# Patient Record
Sex: Male | Born: 2000 | Race: Black or African American | Hispanic: No | Marital: Single | Smoking: Never smoker
Health system: Southern US, Community
[De-identification: ages and names within clinical notes are randomized; demographics above are authoritative.]

---
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
## 2019-04-14 ENCOUNTER — Other Ambulatory Visit: Payer: Self-pay

## 2019-04-14 DIAGNOSIS — Z20822 Contact with and (suspected) exposure to covid-19: Secondary | ICD-10-CM

## 2019-04-18 LAB — NOVEL CORONAVIRUS, NAA: SARS-CoV-2, NAA: DETECTED — AB

## 2019-04-21 ENCOUNTER — Telehealth: Payer: Self-pay | Admitting: *Deleted

## 2019-04-21 NOTE — Telephone Encounter (Signed)
Tare from [facility] called for pt contact number.

## 2019-05-11 ENCOUNTER — Ambulatory Visit: Payer: Self-pay | Attending: Internal Medicine

## 2019-05-12 ENCOUNTER — Ambulatory Visit: Payer: Self-pay | Attending: Internal Medicine

## 2019-05-12 ENCOUNTER — Other Ambulatory Visit: Payer: Self-pay

## 2019-05-12 DIAGNOSIS — Z20822 Contact with and (suspected) exposure to covid-19: Secondary | ICD-10-CM

## 2019-05-14 LAB — NOVEL CORONAVIRUS, NAA: SARS-CoV-2, NAA: NOT DETECTED

## 2021-10-25 ENCOUNTER — Ambulatory Visit
Admission: EM | Admit: 2021-10-25 | Discharge: 2021-10-25 | Disposition: A | Discharge status: Home / Self Care | Payer: BLUE CROSS/BLUE SHIELD | Attending: Nurse Practitioner | Admitting: Nurse Practitioner

## 2021-10-25 DIAGNOSIS — L03116 Cellulitis of left lower limb: Secondary | ICD-10-CM | POA: Diagnosis not present

## 2021-10-25 MED ORDER — SULFAMETHOXAZOLE-TRIMETHOPRIM 800-160 MG PO TABS: 1.0000 | ORAL_TABLET | Freq: Two times a day (BID) | ORAL | 0 refills | Status: AC

## 2021-10-25 NOTE — Discharge Instructions (Addendum)
-   Please take the entire prescription of Bactrim which will treat the infection -You can take Tylenol and/or ibuprofen as needed for pain -Please seek care if symptoms persist or worsen despite treatment

## 2021-10-25 NOTE — ED Provider Notes (Signed)
[facility]    CSN: [id number] Arrival date & time: 10/25/21  1507      History   Chief Complaint Chief Complaint  Patient presents with   Insect Bite    HPI Isaiah Porter is a 21 y.o. male.   Patient presents today for insect bite to left thigh and right inner thigh.  He noticed the bite on the left leg 2 days ago and the area on the inner thigh earlier today.  Reports both of the areas are red and the leg thigh bite is becoming larger and more red/swollen.  He denies seeing an insect bite him.  Also denies fevers, nausea/vomiting.  He has not taken anything for the bites or tried anything.    History reviewed. No pertinent past medical history.  Patient Active Problem List   Diagnosis Date Noted   Poor school compliance 03/27/2012   INFLUENZA DUE TO ID NOVEL H1N1 INFLUENZA VIRUS 03/01/2008    History reviewed. No pertinent surgical history.     Home Medications    Prior to Admission medications   Medication Sig Start Date End Date Taking? Authorizing Provider  sulfamethoxazole-trimethoprim (BACTRIM DS) 800-160 MG tablet Take 1 tablet by mouth 2 (two) times daily for 5 days. 10/25/21 10/30/21 Yes Cathlean Marseilles A, NP  adapalene (DIFFERIN) 0.1 % cream Apply topically at bedtime. 02/18/14   Tommie Sams, DO  mupirocin ointment (BACTROBAN) 2 % Place 1 application into the nose 2 (two) times daily. To rash for one week 05/12/13   Truddie Coco, DO    Family History History reviewed. No pertinent family history.  Social History Social History   Tobacco Use   Smoking status: Never   Smokeless tobacco: Never     Allergies   Patient has no known allergies.   Review of Systems Review of Systems Per HPI  Physical Exam Triage Vital Signs ED Triage Vitals  Enc Vitals Group     BP 10/25/21 1521 112/69     Pulse Rate 10/25/21 1521 69     Resp 10/25/21 1521 18     Temp 10/25/21 1521 99.1 F (37.3 C)     Temp src --      SpO2 10/25/21 1521 96 %      Weight --      Height --      Head Circumference --      Peak Flow --      Pain Score 10/25/21 1519 2     Pain Loc --      Pain Edu? --      Excl. in GC? --    No data found.  Updated Vital Signs BP 112/69   Pulse 69   Temp 99.1 F (37.3 C)   Resp 18   SpO2 96%   Visual Acuity Right Eye Distance:   Left Eye Distance:   Bilateral Distance:    Right Eye Near:   Left Eye Near:    Bilateral Near:     Physical Exam Vitals and nursing note reviewed.  Constitutional:      General: He is not in acute distress.    Appearance: Normal appearance. He is not toxic-appearing.  Pulmonary:     Effort: Pulmonary effort is normal. No respiratory distress.  Skin:    General: Skin is warm and dry.     Findings: Erythema present.       Neurological:     Mental Status: He is alert and oriented to person, place,  and time.  Psychiatric:        Behavior: Behavior is cooperative.      UC Treatments / Results  Labs (all labs ordered are listed, but only abnormal results are displayed) Labs Reviewed - No data to display  EKG   Radiology No results found.  Procedures Procedures (including critical care time)  Medications Ordered in UC Medications - No data to display  Initial Impression / Assessment and Plan / UC Course  I have reviewed the triage vital signs and the nursing notes.  Pertinent labs & imaging results that were available during my care of the patient were reviewed by me and considered in my medical decision making (see chart for details).    Patient is a very pleasant, 21 year old male presenting for an insect bite.  I am concerned about cellulitis of the left lower extremity.  Treat with Bactrim DS twice daily for 5 days.  Seek care if symptoms persist or worsen despite treatment. Final Clinical Impressions(s) / UC Diagnoses   Final diagnoses:  Cellulitis of left lower extremity     Discharge Instructions      - Please take the entire  prescription of Bactrim which will treat the infection -You can take Tylenol and/or ibuprofen as needed for pain -Please seek care if symptoms persist or worsen despite treatment     ED Prescriptions     Medication Sig Dispense Auth. Provider   sulfamethoxazole-trimethoprim (BACTRIM DS) 800-160 MG tablet Take 1 tablet by mouth 2 (two) times daily for 5 days. 10 tablet Valentino Nose, NP      PDMP not reviewed this encounter.   Valentino Nose, NP 10/25/21 1537

## 2021-10-25 NOTE — ED Triage Notes (Signed)
Pt presents with c/o possible insect bite that has become swollen on upper left thigh

## 2021-10-31 ENCOUNTER — Ambulatory Visit
Admission: EM | Admit: 2021-10-31 | Discharge: 2021-10-31 | Disposition: A | Discharge status: Home / Self Care | Payer: BLUE CROSS/BLUE SHIELD | Attending: Nurse Practitioner | Admitting: Nurse Practitioner

## 2021-10-31 DIAGNOSIS — L0291 Cutaneous abscess, unspecified: Secondary | ICD-10-CM

## 2021-10-31 MED ORDER — DOXYCYCLINE HYCLATE 100 MG PO CAPS: 100.0000 mg | ORAL_CAPSULE | Freq: Two times a day (BID) | ORAL | 0 refills | Status: AC

## 2021-10-31 NOTE — ED Provider Notes (Signed)
[facility]    CSN: [id number] Arrival date & time: 10/31/21  1049      History   Chief Complaint Chief Complaint  Patient presents with   Insect Bite    HPI Isaiah Porter is a 21 y.o. male.   Patient presents with worsening skin irritation to left thigh.  Reports the area has become raised, is red, very tender to palpation, and starting to drain.  He denies fevers, nausea/vomiting.  He has taken full course of Bactrim without relief.   Patient presents with  History reviewed. No pertinent past medical history.  Patient Active Problem List   Diagnosis Date Noted   Poor school compliance 03/27/2012   INFLUENZA DUE TO ID NOVEL [id number] INFLUENZA VIRUS 03/01/2008    History reviewed. No pertinent surgical history.     Home Medications    Prior to Admission medications   Medication Sig Start Date End Date Taking? Authorizing Provider  doxycycline (VIBRAMYCIN) 100 MG capsule Take 1 capsule (100 mg total) by mouth 2 (two) times daily for 10 days. 10/31/21 11/10/21 Yes Cathlean Marseilles A, NP  adapalene (DIFFERIN) 0.1 % cream Apply topically at bedtime. 02/18/14   Tommie Sams, DO  mupirocin ointment (BACTROBAN) 2 % Place 1 application into the nose 2 (two) times daily. To rash for one week 05/12/13   Truddie Coco, DO    Family History Family History  Problem Relation Age of Onset   Healthy Mother     Social History Social History   Tobacco Use   Smoking status: Never   Smokeless tobacco: Never  Substance Use Topics   Alcohol use: Never   Drug use: Never     Allergies   Patient has no known allergies.   Review of Systems Review of Systems Per HPI  Physical Exam Triage Vital Signs ED Triage Vitals  Enc Vitals Group     BP 10/31/21 1219 125/68     Pulse Rate 10/31/21 1219 69     Resp 10/31/21 1219 16     Temp 10/31/21 1219 99.3 F (37.4 C)     Temp Source 10/31/21 1219 Oral     SpO2 10/31/21 1219 99 %     Weight --      Height --       Head Circumference --      Peak Flow --      Pain Score 10/31/21 1217 7     Pain Loc --      Pain Edu? --      Excl. in GC? --    No data found.  Updated Vital Signs BP 125/68 (BP Location: Right Arm)   Pulse 69   Temp 99.3 F (37.4 C) (Oral)   Resp 16   SpO2 99%   Visual Acuity Right Eye Distance:   Left Eye Distance:   Bilateral Distance:    Right Eye Near:   Left Eye Near:    Bilateral Near:     Physical Exam Vitals and nursing note reviewed.  Constitutional:      General: He is not in acute distress.    Appearance: Normal appearance. He is not toxic-appearing.  Pulmonary:     Effort: Pulmonary effort is normal. No respiratory distress.  Skin:    General: Skin is warm and dry.     Capillary Refill: Capillary refill takes less than 2 seconds.     Coloration: Skin is not jaundiced or pale.     Findings: Abscess and  erythema present. No rash.          Comments: Abscess to left thigh area marked; approximately 3 cm x 3 cm, fluctuant, raised, warm, tender to palpation  Neurological:     Mental Status: He is alert and oriented to person, place, and time.  Psychiatric:        Behavior: Behavior is cooperative.      UC Treatments / Results  Labs (all labs ordered are listed, but only abnormal results are displayed) Labs Reviewed - No data to display  EKG   Radiology No results found.  Procedures Incision and Drainage  Date/Time: 10/31/2021 2:18 PM  Performed by: Valentino Nose, NP Authorized by: Valentino Nose, NP   Consent:    Consent obtained:  Verbal   Consent given by:  Patient   Risks, benefits, and alternatives were discussed: yes     Risks discussed:  Bleeding, incomplete drainage and pain   Alternatives discussed:  No treatment Universal protocol:    Procedure explained and questions answered to patient or proxy's satisfaction: yes     Patient identity confirmed:  Verbally with patient Location:    Type:  Abscess   Size:  3 cm  x 3 cm   Location:  Lower extremity   Lower extremity location:  Leg   Leg location:  L upper leg Pre-procedure details:    Skin preparation:  Chlorhexidine and povidone-iodine Sedation:    Sedation type:  None Anesthesia:    Anesthesia method:  Local infiltration   Local anesthetic:  Lidocaine 2% WITH epi Procedure type:    Complexity:  Simple Procedure details:    Ultrasound guidance: no     Needle aspiration: no     Incision types:  Single straight   Incision depth:  Dermal   Drainage:  Purulent and bloody   Drainage amount:  Moderate   Wound treatment:  Wound left open   Packing materials:  None Post-procedure details:    Procedure completion:  Tolerated  (including critical care time)  Medications Ordered in UC Medications - No data to display  Initial Impression / Assessment and Plan / UC Course  I have reviewed the triage vital signs and the nursing notes.  Pertinent labs & imaging results that were available during my care of the patient were reviewed by me and considered in my medical decision making (see chart for details).    Patient is a very pleasant, well-appearing 21 year old male presenting for an abscess to his left thigh.  I&D as above.  Patient requested friend be in the room during procedure; the friend was called and was present during the procedure.  Wound care discussed.  Start doxycycline 100 mg twice daily for 10 days.  Seek care if symptoms persist or worsen despite treatment. Final Clinical Impressions(s) / UC Diagnoses   Final diagnoses:  Abscess     Discharge Instructions      - We drained the abscess on your leg today -Please follow the wound care we discussed -Start the doxycycline and take it twice daily for 10 days to help clear up the infection -If you develop fever, nausea/vomiting, decreased appetite, fatigue, please go to the ER    ED Prescriptions     Medication Sig Dispense Auth. Provider   doxycycline (VIBRAMYCIN) 100 MG  capsule Take 1 capsule (100 mg total) by mouth 2 (two) times daily for 10 days. 20 capsule Valentino Nose, NP      PDMP not reviewed this  encounter.   Valentino Nose, NP 10/31/21 1419

## 2023-01-17 ENCOUNTER — Encounter (HOSPITAL_COMMUNITY): Payer: Self-pay | Admitting: Emergency Medicine

## 2023-01-17 ENCOUNTER — Other Ambulatory Visit: Payer: Self-pay

## 2023-01-17 ENCOUNTER — Emergency Department (HOSPITAL_COMMUNITY): Payer: Worker's Compensation

## 2023-01-17 ENCOUNTER — Emergency Department (HOSPITAL_COMMUNITY)
Admission: EM | Admit: 2023-01-17 | Discharge: 2023-01-18 | Disposition: A | Discharge status: Home / Self Care | Payer: Worker's Compensation | Attending: Emergency Medicine | Admitting: Emergency Medicine

## 2023-01-17 DIAGNOSIS — Y990 Civilian activity done for income or pay: Secondary | ICD-10-CM | POA: Insufficient documentation

## 2023-01-17 DIAGNOSIS — W231XXA Caught, crushed, jammed, or pinched between stationary objects, initial encounter: Secondary | ICD-10-CM | POA: Insufficient documentation

## 2023-01-17 DIAGNOSIS — Y9302 Activity, running: Secondary | ICD-10-CM | POA: Diagnosis not present

## 2023-01-17 DIAGNOSIS — S67191A Crushing injury of left index finger, initial encounter: Secondary | ICD-10-CM | POA: Diagnosis present

## 2023-01-17 DIAGNOSIS — S60022A Contusion of left index finger without damage to nail, initial encounter: Secondary | ICD-10-CM | POA: Diagnosis not present

## 2023-01-17 DIAGNOSIS — S6710XA Crushing injury of unspecified finger(s), initial encounter: Secondary | ICD-10-CM

## 2023-01-17 NOTE — ED Triage Notes (Signed)
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

## 2023-01-18 MED ORDER — BACITRACIN ZINC 500 UNIT/GM EX OINT
TOPICAL_OINTMENT | Freq: Two times a day (BID) | CUTANEOUS | Status: DC
  Filled 2023-01-18: qty 0.9

## 2023-01-18 NOTE — Discharge Instructions (Signed)
Local wound care with bacitracin and dressing changes twice daily.  Wear splint for comfort and protection.  Return to the ER if the finger becomes reddened, red streaks extend up the hand or forearm, for increasing pain, or for other new and concerning symptoms.

## 2023-01-18 NOTE — ED Provider Notes (Signed)
  [place] [facility] Provider Note   CSN: [id number] Arrival date & time: 01/17/23  2306     History  Chief Complaint  Patient presents with   Finger Injury    Jaffar Riedman Pettaway is a 22 y.o. male.  Patient is a 22 year old male presenting with a left index finger injury.  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  He describes pain and swelling as well as an abrasion to the dorsal aspect.  The history is provided by the patient.       Home Medications Prior to Admission medications   Medication Sig Start Date End Date Taking? Authorizing Provider  adapalene (DIFFERIN) 0.1 % cream Apply topically at bedtime. 02/18/14   Tommie Sams, DO  mupirocin ointment (BACTROBAN) 2 % Place 1 application into the nose 2 (two) times daily. To rash for one week 05/12/13   Truddie Coco, DO      Allergies    Patient has no known allergies.    Review of Systems   Review of Systems  All other systems reviewed and are negative.   Physical Exam Updated Vital Signs BP (!) 110/50 (BP Location: Right Arm)   Pulse 78   Temp 98.7 F (37.1 C) (Oral)   Resp 17   Ht 5\' 7"  (1.702 m)   Wt 71.2 kg   SpO2 98%   BMI 24.59 kg/m  Physical Exam Vitals and nursing note reviewed.  Constitutional:      Appearance: Normal appearance.  Pulmonary:     Effort: Pulmonary effort is normal.  Musculoskeletal:     Comments: The left index finger has an area of bruising noted to the flexor surface of the middle phalanx.  There is also a small superficial laceration noted to the dorsum of the finger overlying the DIP joint.  He is able to flex and extend the finger and sensation is intact.  Skin:    General: Skin is warm and dry.  Neurological:     Mental Status: He is alert.     ED Results / Procedures / Treatments   Labs (all labs ordered are listed, but only abnormal results are displayed) Labs Reviewed - No data to  display  EKG None  Radiology DG Finger Index Left  Result Date: 01/18/2023 CLINICAL DATA:  Status post trauma. EXAM: LEFT INDEX FINGER 2+V COMPARISON:  None Available. FINDINGS: There is no evidence of fracture or dislocation. There is no evidence of arthropathy or other focal bone abnormality. Soft tissues are unremarkable. IMPRESSION: Negative. Electronically Signed   By: Aram Candela M.D.   On: 01/18/2023 00:10    Procedures Procedures    Medications Ordered in ED Medications  bacitracin ointment (has no administration in time range)    ED Course/ Medical Decision Making/ A&P  Patient presenting with a crush injury to the left index finger.  X-rays are negative and there are no suturable lacerations.  There is some ecchymosis.  Patient to be treated with local wound care and a finger splint.  To follow-up as needed.  He has full range of motion of the finger with no evidence for tendon injury.  Final Clinical Impression(s) / ED Diagnoses Final diagnoses:  None    Rx / DC Orders ED Discharge Orders     None         Geoffery Lyons, MD 01/18/23 [phone]
